# Patient Record
Sex: Female | Born: 1995 | Race: White | Hispanic: No | Marital: Single | Smoking: Never smoker
Health system: Southern US, Community
[De-identification: ages and names within clinical notes are randomized; demographics above are authoritative.]

## PROBLEM LIST (undated history)

## (undated) DIAGNOSIS — J302 Other seasonal allergic rhinitis: Secondary | ICD-10-CM

## (undated) DIAGNOSIS — F988 Other specified behavioral and emotional disorders with onset usually occurring in childhood and adolescence: Secondary | ICD-10-CM

---

## 2018-02-04 ENCOUNTER — Emergency Department (HOSPITAL_COMMUNITY)
Admission: EM | Admit: 2018-02-04 | Discharge: 2018-02-04 | Disposition: A | Discharge status: Home / Self Care | Payer: PRIVATE HEALTH INSURANCE | Attending: Emergency Medicine | Admitting: Emergency Medicine

## 2018-02-04 ENCOUNTER — Emergency Department (HOSPITAL_COMMUNITY): Payer: PRIVATE HEALTH INSURANCE

## 2018-02-04 ENCOUNTER — Other Ambulatory Visit: Payer: Self-pay

## 2018-02-04 ENCOUNTER — Encounter (HOSPITAL_COMMUNITY): Payer: Self-pay | Admitting: *Deleted

## 2018-02-04 DIAGNOSIS — S40022A Contusion of left upper arm, initial encounter: Secondary | ICD-10-CM | POA: Diagnosis not present

## 2018-02-04 DIAGNOSIS — M791 Myalgia, unspecified site: Secondary | ICD-10-CM | POA: Diagnosis not present

## 2018-02-04 DIAGNOSIS — Y9389 Activity, other specified: Secondary | ICD-10-CM | POA: Diagnosis not present

## 2018-02-04 DIAGNOSIS — Y929 Unspecified place or not applicable: Secondary | ICD-10-CM | POA: Insufficient documentation

## 2018-02-04 DIAGNOSIS — M7918 Myalgia, other site: Secondary | ICD-10-CM

## 2018-02-04 DIAGNOSIS — T07XXXA Unspecified multiple injuries, initial encounter: Secondary | ICD-10-CM

## 2018-02-04 DIAGNOSIS — Y999 Unspecified external cause status: Secondary | ICD-10-CM | POA: Insufficient documentation

## 2018-02-04 DIAGNOSIS — S4992XA Unspecified injury of left shoulder and upper arm, initial encounter: Secondary | ICD-10-CM | POA: Diagnosis present

## 2018-02-04 HISTORY — DX: Other specified behavioral and emotional disorders with onset usually occurring in childhood and adolescence: F98.8

## 2018-02-04 HISTORY — DX: Other seasonal allergic rhinitis: J30.2

## 2018-02-04 MED ORDER — IBUPROFEN 600 MG PO TABS: 600.0000 mg | ORAL_TABLET | Freq: Four times a day (QID) | ORAL | 0 refills | Status: AC | PRN

## 2018-02-04 MED ORDER — IBUPROFEN 400 MG PO TABS
600.0000 mg | ORAL_TABLET | Freq: Once | ORAL | Status: AC
  Administered 2018-02-04: 600 mg via ORAL
  Filled 2018-02-04: qty 1
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
## 2018-02-04 NOTE — ED Notes (Signed)
Pt verbalized understanding discharge instructions and denies any further needs or questions at this time. VS stable, ambulatory and steady gait.   

## 2018-02-04 NOTE — ED Notes (Signed)
US outside the door
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
